# Patient Record
Sex: Female | Born: 1995 | Race: White | Hispanic: No | Marital: Single | Smoking: Never smoker
Health system: Southern US, Community
[De-identification: ages and names within clinical notes are randomized; demographics above are authoritative.]

## PROBLEM LIST (undated history)

## (undated) DIAGNOSIS — J069 Acute upper respiratory infection, unspecified: Secondary | ICD-10-CM

## (undated) DIAGNOSIS — J45909 Unspecified asthma, uncomplicated: Secondary | ICD-10-CM

## (undated) HISTORY — DX: Acute upper respiratory infection, unspecified: J06.9

## (undated) HISTORY — DX: Unspecified asthma, uncomplicated: J45.909

---
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
## 2015-02-04 DIAGNOSIS — H1010 Acute atopic conjunctivitis, unspecified eye: Secondary | ICD-10-CM | POA: Insufficient documentation

## 2015-02-04 DIAGNOSIS — J309 Allergic rhinitis, unspecified: Principal | ICD-10-CM

## 2015-02-04 DIAGNOSIS — J45909 Unspecified asthma, uncomplicated: Secondary | ICD-10-CM | POA: Insufficient documentation

## 2016-12-01 ENCOUNTER — Ambulatory Visit (INDEPENDENT_AMBULATORY_CARE_PROVIDER_SITE_OTHER): Payer: BLUE CROSS/BLUE SHIELD | Admitting: Allergy and Immunology

## 2016-12-01 ENCOUNTER — Encounter: Payer: Self-pay | Admitting: Allergy and Immunology

## 2016-12-01 VITALS — BP 120/72 | HR 84 | Resp 20 | Ht 63.78 in | Wt 110.6 lb

## 2016-12-01 DIAGNOSIS — H1010 Acute atopic conjunctivitis, unspecified eye: Secondary | ICD-10-CM

## 2016-12-01 DIAGNOSIS — J3089 Other allergic rhinitis: Secondary | ICD-10-CM | POA: Diagnosis not present

## 2016-12-01 DIAGNOSIS — H1045 Other chronic allergic conjunctivitis: Secondary | ICD-10-CM | POA: Diagnosis not present

## 2016-12-01 DIAGNOSIS — Z8709 Personal history of other diseases of the respiratory system: Secondary | ICD-10-CM

## 2016-12-01 MED ORDER — MONTELUKAST SODIUM 10 MG PO TABS: 10.0000 mg | ORAL_TABLET | Freq: Every day | ORAL | 11 refills | Status: AC

## 2016-12-01 MED ORDER — OLOPATADINE HCL 0.1 % OP SOLN: OPHTHALMIC | 12 refills | Status: AC

## 2016-12-01 NOTE — Patient Instructions (Addendum)
  1. Allergen avoidance measures as best as possible  2. Consider a course of immunotherapy (would need to confirm cat allergy)  3. Treat and prevent inflammation:   A. Nasacort one spray each nostril one time per day  B. montelukast 10 mg tablet 1 time per day  4. If needed:   A. OTC antihistamine - loratadine/cetirizine/fexofenadine  B. Patanol - one drop each eye 1 - 2 times per day  5. Obtain a fall flu vaccine  6. Return to clinic in 1 year or earlier if problem

## 2016-12-01 NOTE — Progress Notes (Signed)
Follow-up Note  Referring Provider: Marylen Copeland, Jamie S, MD Primary Provider: Marylen Copeland, Jamie S, MD Date of Office Visit: 12/01/2016  Subjective:   Jamie Copeland (DOB: 02-Oct-1995) is a 21 y.o. female who [facility] in re-evaluation of the following:  HPI: Jamie Copeland returns to this clinic in reevaluation of her allergic rhinoconjunctivitis and distant history of asthma. She is not been seen in his clinic in 2 years.  She still continues to have problems with sneezing and nasal congestion and itchy red watery eyes especially following exposure to the outdoors and especially following exposure to cats. She states that she has developed "sinusitis" every month in the past few seasons. In January and February and May and June she was treated with antibiotics and she was also treated with systemic steroids on 3 occasions during this timeframe.  Her asthma has been under excellent control and in fact is basically nonexistent. She can exercise without any difficulty and has not used a short-acting bronchodilator in years.  Allergies as of 12/01/2016   No Known Allergies     Medication List      loratadine 10 MG tablet Commonly known as:  CLARITIN Take 10 mg by mouth daily.   multivitamin Liqd Take 5 mLs by mouth daily.   NASACORT ALLERGY 24HR 55 MCG/ACT Aero nasal inhaler Generic drug:  triamcinolone Place 1 spray into the nose daily.   norgestimate-ethinyl estradiol 0.25-35 MG-MCG tablet Commonly known as:  ORTHO-CYCLEN,SPRINTEC,PREVIFEM Take 1 tablet by mouth daily.       Past Medical History:  Diagnosis Date  . Asthma   . Recurrent upper respiratory infection (URI)     History reviewed. No pertinent surgical history.  Review of systems negative except as noted in HPI / PMHx or noted below:  Review of Systems  Constitutional: Negative.   HENT: Negative.   Eyes: Negative.   Respiratory: Negative.   Cardiovascular:  Negative.   Gastrointestinal: Negative.   Genitourinary: Negative.   Musculoskeletal: Negative.   Skin: Negative.   Neurological: Negative.   Endo/Heme/Allergies: Negative.   Psychiatric/Behavioral: Negative.      Objective:   Vitals:   12/01/16 1617  BP: 120/72  Pulse: 84  Resp: 20   Height: 5' 3.78" (162 cm)  Weight: 110 lb 9.6 oz (50.2 kg)   Physical Exam  Constitutional: She is well-developed, well-nourished, and in no distress.  HENT:  Head: Normocephalic.  Right Ear: Tympanic membrane, external ear and ear canal normal.  Left Ear: Tympanic membrane, external ear and ear canal normal.  Nose: Nose normal. No mucosal edema or rhinorrhea.  Mouth/Throat: Uvula is midline, oropharynx is clear and moist and mucous membranes are normal. No oropharyngeal exudate.  Eyes: Conjunctivae are normal.  Neck: Trachea normal. No tracheal tenderness present. No tracheal deviation present. No thyromegaly present.  Cardiovascular: Normal rate, regular rhythm, S1 normal, S2 normal and normal heart sounds.   No murmur heard. Pulmonary/Chest: Breath sounds normal. No stridor. No respiratory distress. She has no wheezes. She has no rales.  Musculoskeletal: She exhibits no edema.  Lymphadenopathy:       Head (right side): No tonsillar adenopathy present.       Head (left side): No tonsillar adenopathy present.    She has no cervical adenopathy.  Neurological: She is alert. Gait normal.  Skin: No rash noted. She is not diaphoretic. No erythema. Nails show no clubbing.  Psychiatric: Mood and affect normal.    Diagnostics:  Review of her previous medical record identified skin testing performed 12/11/2014 which identified hypersensitivity against Southern grasses, weeds mix, trees including [place]ory and pecan, and house dust mite. At that point she did not demonstrate hypersensitivity to cat.   Spirometry was performed and demonstrated an FEV1 of 2.82 at 84 % of predicted.  Assessment and  Plan:   1. Other allergic rhinitis   2. Seasonal allergic conjunctivitis   3. History of asthma     1. Allergen avoidance measures as best as possible  2. Consider a course of immunotherapy (would need to confirm cat allergy)  3. Treat and prevent inflammation:   A. Nasacort one spray each nostril one time per day  B. montelukast 10 mg tablet 1 time per day  4. If needed:   A. OTC antihistamine - loratadine/cetirizine/fexofenadine  B. Patanol - one drop each eye 1 - 2 times per day  5. Obtain a fall flu vaccine  6. Return to clinic in 1 year or earlier if problem  Jamie Copeland will use a combination of allergen avoidance measures, leukotriene modifier, and a nasal steroid in an attempt to get her atopic respiratory and conjunctival disease under better control. At this point I think her asthma is basically nonexistent and does not require any attention as she is probably a long-term remitter at this point. She is a candidate for immunotherapy and I have given her literature on this form of treatment. She believes that she has sensitivity to cats and we will need to confirm this objectively with either a repeat skin test or a Specific IgE antibody titer if we are going to include this in her immunotherapy extract.  Jamie Schimke, MD Allergy / Immunology [facility]

## 2016-12-23 ENCOUNTER — Telehealth: Payer: Self-pay | Admitting: Allergy and Immunology

## 2016-12-23 NOTE — Telephone Encounter (Signed)
Mom upset because Dr. Lucie LeatherKozlow charged spiro - sent him a message - also read all his notes about asthma to her - kt

## 2016-12-23 NOTE — Telephone Encounter (Signed)
Please call pt mother back at [phone] regarding a bill received. She has a few questions. Thanks

## 2016-12-23 NOTE — Telephone Encounter (Signed)
Left message to call me back.

## 2017-03-25 DIAGNOSIS — J205 Acute bronchitis due to respiratory syncytial virus: Secondary | ICD-10-CM | POA: Diagnosis not present

## 2017-05-18 DIAGNOSIS — M545 Low back pain: Secondary | ICD-10-CM | POA: Diagnosis not present

## 2017-05-19 DIAGNOSIS — Z1339 Encounter for screening examination for other mental health and behavioral disorders: Secondary | ICD-10-CM | POA: Diagnosis not present

## 2017-05-19 DIAGNOSIS — Z681 Body mass index (BMI) 19 or less, adult: Secondary | ICD-10-CM | POA: Diagnosis not present

## 2017-05-19 DIAGNOSIS — Z309 Encounter for contraceptive management, unspecified: Secondary | ICD-10-CM | POA: Diagnosis not present

## 2017-06-02 DIAGNOSIS — Z681 Body mass index (BMI) 19 or less, adult: Secondary | ICD-10-CM | POA: Diagnosis not present

## 2017-06-02 DIAGNOSIS — R591 Generalized enlarged lymph nodes: Secondary | ICD-10-CM | POA: Diagnosis not present

## 2017-07-31 DIAGNOSIS — Z681 Body mass index (BMI) 19 or less, adult: Secondary | ICD-10-CM | POA: Diagnosis not present

## 2017-07-31 DIAGNOSIS — Z124 Encounter for screening for malignant neoplasm of cervix: Secondary | ICD-10-CM | POA: Diagnosis not present

## 2017-07-31 DIAGNOSIS — Z1389 Encounter for screening for other disorder: Secondary | ICD-10-CM | POA: Diagnosis not present

## 2017-07-31 DIAGNOSIS — Z01419 Encounter for gynecological examination (general) (routine) without abnormal findings: Secondary | ICD-10-CM | POA: Diagnosis not present

## 2017-07-31 DIAGNOSIS — Z113 Encounter for screening for infections with a predominantly sexual mode of transmission: Secondary | ICD-10-CM | POA: Diagnosis not present

## 2017-09-28 DIAGNOSIS — D235 Other benign neoplasm of skin of trunk: Secondary | ICD-10-CM | POA: Diagnosis not present

## 2017-09-28 DIAGNOSIS — D485 Neoplasm of uncertain behavior of skin: Secondary | ICD-10-CM | POA: Diagnosis not present

## 2017-09-28 DIAGNOSIS — L570 Actinic keratosis: Secondary | ICD-10-CM | POA: Diagnosis not present

## 2017-10-24 DIAGNOSIS — J0100 Acute maxillary sinusitis, unspecified: Secondary | ICD-10-CM | POA: Diagnosis not present

## 2018-05-03 DIAGNOSIS — J0100 Acute maxillary sinusitis, unspecified: Secondary | ICD-10-CM | POA: Diagnosis not present

## 2018-05-15 DIAGNOSIS — N920 Excessive and frequent menstruation with regular cycle: Secondary | ICD-10-CM | POA: Diagnosis not present

## 2018-05-19 DIAGNOSIS — L259 Unspecified contact dermatitis, unspecified cause: Secondary | ICD-10-CM | POA: Diagnosis not present

## 2018-06-14 DIAGNOSIS — Z681 Body mass index (BMI) 19 or less, adult: Secondary | ICD-10-CM | POA: Diagnosis not present

## 2018-06-14 DIAGNOSIS — M545 Low back pain: Secondary | ICD-10-CM | POA: Diagnosis not present

## 2018-06-14 DIAGNOSIS — M5137 Other intervertebral disc degeneration, lumbosacral region: Secondary | ICD-10-CM | POA: Diagnosis not present

## 2018-06-14 DIAGNOSIS — M7918 Myalgia, other site: Secondary | ICD-10-CM | POA: Diagnosis not present

## 2018-06-21 DIAGNOSIS — N921 Excessive and frequent menstruation with irregular cycle: Secondary | ICD-10-CM | POA: Diagnosis not present

## 2018-06-21 DIAGNOSIS — R309 Painful micturition, unspecified: Secondary | ICD-10-CM | POA: Diagnosis not present

## 2018-07-01 DIAGNOSIS — J0100 Acute maxillary sinusitis, unspecified: Secondary | ICD-10-CM | POA: Diagnosis not present

## 2018-08-02 DIAGNOSIS — Z113 Encounter for screening for infections with a predominantly sexual mode of transmission: Secondary | ICD-10-CM | POA: Diagnosis not present

## 2018-08-02 DIAGNOSIS — Z681 Body mass index (BMI) 19 or less, adult: Secondary | ICD-10-CM | POA: Diagnosis not present

## 2018-08-02 DIAGNOSIS — Z1151 Encounter for screening for human papillomavirus (HPV): Secondary | ICD-10-CM | POA: Diagnosis not present

## 2018-08-02 DIAGNOSIS — Z01419 Encounter for gynecological examination (general) (routine) without abnormal findings: Secondary | ICD-10-CM | POA: Diagnosis not present

## 2018-08-02 DIAGNOSIS — Z1389 Encounter for screening for other disorder: Secondary | ICD-10-CM | POA: Diagnosis not present

## 2018-08-02 DIAGNOSIS — Z124 Encounter for screening for malignant neoplasm of cervix: Secondary | ICD-10-CM | POA: Diagnosis not present

## 2018-09-20 DIAGNOSIS — R1013 Epigastric pain: Secondary | ICD-10-CM | POA: Diagnosis not present

## 2018-09-20 DIAGNOSIS — Z681 Body mass index (BMI) 19 or less, adult: Secondary | ICD-10-CM | POA: Diagnosis not present

## 2018-09-20 DIAGNOSIS — R109 Unspecified abdominal pain: Secondary | ICD-10-CM | POA: Diagnosis not present

## 2018-10-19 DIAGNOSIS — R109 Unspecified abdominal pain: Secondary | ICD-10-CM | POA: Diagnosis not present

## 2018-10-19 DIAGNOSIS — R1011 Right upper quadrant pain: Secondary | ICD-10-CM | POA: Diagnosis not present

## 2018-12-10 DIAGNOSIS — F4323 Adjustment disorder with mixed anxiety and depressed mood: Secondary | ICD-10-CM | POA: Diagnosis not present
